# Patient Record
Sex: Female | Born: 2000 | Race: Black or African American | Hispanic: No | Marital: Single
Health system: Southern US, Community
[De-identification: ages and names within clinical notes are randomized; demographics above are authoritative.]

## PROBLEM LIST (undated history)

## (undated) DIAGNOSIS — R51 Headache: Secondary | ICD-10-CM

## (undated) HISTORY — DX: Headache: R51

---
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
## 2021-02-28 ENCOUNTER — Emergency Department (HOSPITAL_COMMUNITY)
Admission: EM | Admit: 2021-02-28 | Discharge: 2021-02-28 | Disposition: A | Discharge status: Home / Self Care | Payer: Medicaid Other | Attending: Emergency Medicine | Admitting: Emergency Medicine

## 2021-02-28 ENCOUNTER — Encounter (HOSPITAL_COMMUNITY): Payer: Self-pay | Admitting: Emergency Medicine

## 2021-02-28 ENCOUNTER — Other Ambulatory Visit: Payer: Self-pay

## 2021-02-28 ENCOUNTER — Emergency Department (HOSPITAL_COMMUNITY): Payer: Medicaid Other

## 2021-02-28 DIAGNOSIS — Z23 Encounter for immunization: Secondary | ICD-10-CM | POA: Diagnosis not present

## 2021-02-28 DIAGNOSIS — S65211A Laceration of superficial palmar arch of right hand, initial encounter: Secondary | ICD-10-CM | POA: Diagnosis not present

## 2021-02-28 DIAGNOSIS — Z7722 Contact with and (suspected) exposure to environmental tobacco smoke (acute) (chronic): Secondary | ICD-10-CM | POA: Insufficient documentation

## 2021-02-28 DIAGNOSIS — Y93E5 Activity, floor mopping and cleaning: Secondary | ICD-10-CM | POA: Insufficient documentation

## 2021-02-28 DIAGNOSIS — W268XXA Contact with other sharp object(s), not elsewhere classified, initial encounter: Secondary | ICD-10-CM | POA: Insufficient documentation

## 2021-02-28 DIAGNOSIS — S6991XA Unspecified injury of right wrist, hand and finger(s), initial encounter: Secondary | ICD-10-CM | POA: Diagnosis present

## 2021-02-28 DIAGNOSIS — M25519 Pain in unspecified shoulder: Secondary | ICD-10-CM

## 2021-02-28 MED ORDER — TETANUS-DIPHTH-ACELL PERTUSSIS 5-2.5-18.5 LF-MCG/0.5 IM SUSY
0.5000 mL | PREFILLED_SYRINGE | Freq: Once | INTRAMUSCULAR | Status: AC
  Administered 2021-02-28: 0.5 mL via INTRAMUSCULAR
  Filled 2021-02-28: qty 0.5

## 2021-02-28 NOTE — ED Provider Notes (Signed)
[facility]-EMERGENCY DEPT Provider Note   CSN: [id number] Arrival date & time: 02/28/21  0118     History Chief Complaint  Patient presents with   Arm Pain    Alexis Parker is a 20 y.o. female.  The history is provided by the patient. No language interpreter was used.  Arm Pain   20 year old female presenting for evaluation of hand laceration.  Patient report yesterday she was cleaning a fan when she accidentally cut her right dominant hand on a sharp edge.  She did try to wash the wound.  Since then she noticed intermittent pain that radiates towards her arm which concerns her.  Pain is intermittent and mild at this time.  No numbness or weakness no neck pain.  No other complaint.  She is not up-to-date with tetanus.  Past Medical History:  Diagnosis Date   Headache(784.0)     Patient Active Problem List   Diagnosis Date Noted   Migraine without aura and without status migrainosus, not intractable 01/30/2014   Tension headache 01/30/2014    History reviewed. No pertinent surgical history.   OB History   No obstetric history on file.     Family History  Problem Relation Age of Onset   Migraines Maternal Aunt    Depression Maternal Aunt    Anxiety disorder Maternal Aunt    Multiple sclerosis Maternal Grandfather    Heart attack Paternal Grandmother    Epilepsy Other     Social History   Tobacco Use   Smoking status: Passive Smoke Exposure - Never Smoker   Smokeless tobacco: Never  Substance Use Topics   Alcohol use: No   Drug use: No    Home Medications Prior to Admission medications   Medication Sig Start Date End Date Taking? Authorizing Provider  amitriptyline (ELAVIL) 25 MG tablet Take 1 tablet (25 mg total) by mouth at bedtime. 03/19/18   Keturah Shavers, MD  cetirizine (ZYRTEC) 10 MG tablet Take 10 mg by mouth daily. 03/04/18   [provider]  ibuprofen (ADVIL,MOTRIN) 100 MG/5ML suspension Take 5 mg/kg by mouth  every 6 (six) hours as needed.    [provider]  ibuprofen (ADVIL,MOTRIN) 600 MG tablet TAKE 1 TABLET BY MOUTH EVERY 6 HOURS AS NEEDED WITH FOOD 03/12/18   [provider]  Magnesium Oxide 250 MG TABS Take by mouth.    [provider]  nitrofurantoin, macrocrystal-monohydrate, (MACROBID) 100 MG capsule Take 1 capsule (100 mg total) by mouth 2 (two) times daily. 08/16/18   Wallis Bamberg, PA-C  riboflavin (VITAMIN B-2) 100 MG TABS tablet Take 100 mg by mouth daily.    [provider]  TRI-LO-MARZIA 0.18/0.215/0.25 MG-25 MCG tab Take 1 tablet by mouth daily. 03/04/18   [provider]    Allergies    Patient has no known allergies.  Review of Systems   Review of Systems  Constitutional:  Negative for fever.  Skin:  Positive for wound.   Physical Exam Updated Vital Signs BP 108/72 (BP Location: Left Arm)   Pulse 70   Temp 98.3 F (36.8 C) (Oral)   Resp 16   Ht 4' 10.5" (1.486 m)   Wt 58.5 kg   SpO2 100%   BMI 26.50 kg/m   Physical Exam Vitals and nursing note reviewed.  Constitutional:      General: She is not in acute distress.    Appearance: She is well-developed.  HENT:     Head: Atraumatic.  Eyes:  Conjunctiva/sclera: Conjunctivae normal.  Pulmonary:     Effort: Pulmonary effort is normal.  Musculoskeletal:     Cervical back: Neck supple.     Comments: Right arm: Sensation is intact throughout the arm with no significant tenderness along the forearm or upper arm.  Able to flex and extend and move throughout the joints of the breast, elbow, and shoulder.  Skin:    Findings: No rash.     Comments: Right hand: There is a very superficial laceration approximately 2 cm noted overlying the thenar eminence without any foreign body noted.  No bleeding.  Neurological:     Mental Status: She is alert.  Psychiatric:        Mood and Affect: Mood normal.    ED Results / Procedures / Treatments   Labs (all labs ordered are listed,  but only abnormal results are displayed) Labs Reviewed - No data to display  EKG None  Radiology DG Shoulder Right  Result Date: 02/28/2021 CLINICAL DATA:  Arm pain EXAM: RIGHT SHOULDER - 2+ VIEW COMPARISON:  None. FINDINGS: No fracture or dislocation is seen. The joint spaces are preserved. Visualized soft tissues are within normal limits. Visualized right lung is clear. IMPRESSION: Negative. Electronically Signed   By: Charline Bills M.D.   On: 02/28/2021 03:49   DG Hand Complete Right  Result Date: 02/28/2021 CLINICAL DATA:  Hand pain, laceration EXAM: RIGHT HAND - COMPLETE 3+ VIEW COMPARISON:  None. FINDINGS: No fracture or dislocation is seen. The joint spaces are preserved. The visualized soft tissues are unremarkable. No radiopaque foreign body is seen. IMPRESSION: No fracture, dislocation, or radiopaque foreign body is seen. Electronically Signed   By: Charline Bills M.D.   On: 02/28/2021 03:49    Procedures Procedures   Medications Ordered in ED Medications - No data to display  ED Course  I have reviewed the triage vital signs and the nursing notes.  Pertinent labs & imaging results that were available during my care of the patient were reviewed by me and considered in my medical decision making (see chart for details).    MDM Rules/Calculators/A&P                           SUBJECTIVE: BP 108/72 (BP Location: Left Arm)   Pulse 70   Temp 98.3 F (36.8 C) (Oral)   Resp 16   Ht 4' 10.5" (1.486 m)   Wt 58.5 kg   SpO2 100%   BMI 26.50 kg/m   Final Clinical Impression(s) / ED Diagnoses Final diagnoses:  Shoulder pain  Laceration of superficial palmar arch of right hand, initial encounter    Rx / DC Orders ED Discharge Orders     None      Patient complaining of right arm pain after suffering a cut to her right palm.  This is a very superficial laceration without any neurovascular involvement.  On exam unremarkable.  X-rays unremarkable.  Reassurance  given.  Patient will receive a Tdap here.  Otherwise she is stable for discharge.  Laceration does not require any repair.   Fayrene Helper, PA-C 02/28/21 [id number]    Koleen Distance, MD 02/28/21 1556

## 2021-02-28 NOTE — ED Notes (Signed)
Pt provided ice pack 

## 2021-02-28 NOTE — ED Triage Notes (Signed)
Pt cut her right hand on her palm and has pain from her hand up her right arm. Pt states her pain is 9/10

## 2022-01-28 ENCOUNTER — Other Ambulatory Visit: Payer: Self-pay

## 2022-01-28 ENCOUNTER — Emergency Department (HOSPITAL_BASED_OUTPATIENT_CLINIC_OR_DEPARTMENT_OTHER)
Admission: EM | Admit: 2022-01-28 | Discharge: 2022-01-28 | Disposition: A | Discharge status: Home / Self Care | Payer: Medicaid Other | Attending: Emergency Medicine | Admitting: Emergency Medicine

## 2022-01-28 DIAGNOSIS — S29019A Strain of muscle and tendon of unspecified wall of thorax, initial encounter: Secondary | ICD-10-CM

## 2022-01-28 DIAGNOSIS — S29012A Strain of muscle and tendon of back wall of thorax, initial encounter: Secondary | ICD-10-CM | POA: Diagnosis not present

## 2022-01-28 DIAGNOSIS — Y92410 Unspecified street and highway as the place of occurrence of the external cause: Secondary | ICD-10-CM | POA: Insufficient documentation

## 2022-01-28 DIAGNOSIS — S299XXA Unspecified injury of thorax, initial encounter: Secondary | ICD-10-CM | POA: Diagnosis present

## 2022-01-28 MED ORDER — NAPROXEN 500 MG PO TABS: 500.0000 mg | ORAL_TABLET | Freq: Two times a day (BID) | ORAL | 0 refills | Status: DC

## 2022-01-28 NOTE — ED Notes (Addendum)
Error

## 2022-01-28 NOTE — ED Notes (Signed)
Pt agreeable with d/c plan as discussed by provider- this nurse has verbally reinforced d/c instructions and provided pt with written copy - pt acknowledges verbal understanding and denies any additional questions, concerns, needs  

## 2022-01-28 NOTE — Discharge Instructions (Signed)
Apply ice for 30 minutes at a time, 4 times a day.  If you need additional pain relief, you may take acetaminophen.  When you combine acetaminophen and naproxen, you get better pain relief and you get from taking either medication by itself.  Please follow-up with a sports medicine physician.  You may benefit from a course of physical therapy.
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
## 2022-01-28 NOTE — ED Notes (Signed)
ED provider at bedside.  Pt lying awake in bed - alert and oriented x4.  RR even and unlabored on RA with symmetrical rise and fall of chest.  No obvious bony deformities -- RUE full ROM observed during ED provider exam and RUE distal neurovascular status remains intact.
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
## 2022-03-04 ENCOUNTER — Emergency Department (HOSPITAL_COMMUNITY): Payer: Medicaid Other

## 2022-03-04 ENCOUNTER — Encounter (HOSPITAL_COMMUNITY): Payer: Self-pay | Admitting: Pharmacy Technician

## 2022-03-04 ENCOUNTER — Other Ambulatory Visit: Payer: Self-pay

## 2022-03-04 ENCOUNTER — Emergency Department (HOSPITAL_COMMUNITY)
Admission: EM | Admit: 2022-03-04 | Discharge: 2022-03-04 | Discharge status: Against Medical Advice | Payer: Medicaid Other | Attending: Emergency Medicine | Admitting: Emergency Medicine

## 2022-03-04 DIAGNOSIS — W502XXA Accidental twist by another person, initial encounter: Secondary | ICD-10-CM | POA: Diagnosis not present

## 2022-03-04 DIAGNOSIS — Z5321 Procedure and treatment not carried out due to patient leaving prior to being seen by health care provider: Secondary | ICD-10-CM | POA: Diagnosis not present

## 2022-03-04 DIAGNOSIS — M25572 Pain in left ankle and joints of left foot: Secondary | ICD-10-CM | POA: Insufficient documentation

## 2022-03-04 NOTE — ED Notes (Signed)
Pt called x1

## 2022-03-04 NOTE — ED Triage Notes (Signed)
Pt here with L ankle pain for approx 1 week after missing a step and twisting her ankle. Pt ambulatory to triage room without difficulty.

## 2022-03-04 NOTE — ED Provider Triage Note (Signed)
Emergency Medicine Provider Triage Evaluation Note  Alexis Parker , a 21 y.o. female  was evaluated in triage.  Pt complains of left ankle pain.  Happened a week ago after inverting the ankle and on the step wrong.  She has been walking on it but with some difficulty. Review of Systems  Per HPI  Physical Exam  BP 108/73 (BP Location: Right Arm)   Pulse 75   Temp 98.9 F (37.2 C) (Oral)   Resp 14   SpO2 100%  Gen:   Awake, no distress   Resp:  Normal effort  MSK:   Moves extremities without difficulty  Other:  Flexion extension with pain.  Inversion eversion without any difficulty.  A patient is able to bear weight..  Medical Decision Making  Medically screening exam initiated at 5:51 PM.  Appropriate orders placed.  Alexis Parker was informed that the remainder of the evaluation will be completed by another provider, this initial triage assessment does not replace that evaluation, and the importance of remaining in the ED until their evaluation is complete.     Theron Arista, PA-C 03/04/22 1753

## 2022-03-04 NOTE — ED Notes (Signed)
Pt called X2

## 2022-10-21 ENCOUNTER — Other Ambulatory Visit: Payer: Self-pay

## 2022-10-21 ENCOUNTER — Encounter (HOSPITAL_COMMUNITY): Payer: Self-pay | Admitting: Emergency Medicine

## 2022-10-21 ENCOUNTER — Emergency Department (HOSPITAL_COMMUNITY)
Admission: EM | Admit: 2022-10-21 | Discharge: 2022-10-21 | Disposition: A | Discharge status: Home / Self Care | Payer: Medicaid Other | Attending: Emergency Medicine | Admitting: Emergency Medicine

## 2022-10-21 DIAGNOSIS — M545 Low back pain, unspecified: Secondary | ICD-10-CM | POA: Insufficient documentation

## 2022-10-21 DIAGNOSIS — D72829 Elevated white blood cell count, unspecified: Secondary | ICD-10-CM | POA: Diagnosis not present

## 2022-10-21 DIAGNOSIS — R350 Frequency of micturition: Secondary | ICD-10-CM | POA: Diagnosis present

## 2022-10-21 DIAGNOSIS — N390 Urinary tract infection, site not specified: Secondary | ICD-10-CM | POA: Insufficient documentation

## 2022-10-21 LAB — URINALYSIS, ROUTINE W REFLEX MICROSCOPIC
Bilirubin Urine: NEGATIVE
Glucose, UA: NEGATIVE mg/dL
Hgb urine dipstick: NEGATIVE
Ketones, ur: 5 mg/dL — AB
Nitrite: NEGATIVE
Protein, ur: 30 mg/dL — AB
Specific Gravity, Urine: 1.016 (ref 1.005–1.030)
WBC, UA: >50 WBC/hpf (ref 0–5)
pH: 7 (ref 5.0–8.0)

## 2022-10-21 LAB — PREGNANCY, URINE: Preg Test, Ur: NEGATIVE

## 2022-10-21 MED ORDER — NITROFURANTOIN MONOHYD MACRO 100 MG PO CAPS
100.0000 mg | ORAL_CAPSULE | Freq: Once | ORAL | Status: AC
  Administered 2022-10-21: 100 mg via ORAL
  Filled 2022-10-21: qty 1

## 2022-10-21 MED ORDER — NITROFURANTOIN MONOHYD MACRO 100 MG PO CAPS: 100.0000 mg | ORAL_CAPSULE | Freq: Two times a day (BID) | ORAL | 0 refills | Status: DC

## 2022-10-21 NOTE — ED Triage Notes (Signed)
Pt in with UTI symptoms for a few days. Reports dysuria, low back pain and urinary frequency. Denies any fevers

## 2022-10-21 NOTE — ED Provider Notes (Signed)
[place] EMERGENCY DEPARTMENT AT [facility] Provider Note   CSN: [id number] Arrival date & time: 10/21/22  [id number]     History  Chief Complaint  Patient presents with   Urinary Frequency    Alexis Parker is a 22 y.o. female.   Urinary Frequency  Patient is a 22 year old female present emergency room today with complaints of urinary frequency urgency and dysuria.  She states her symptoms been ongoing for 2 days.  She has not had any fever nausea vomiting or abdominal pain.  No lightheadedness or dizziness.  Does endorse some achy low back pain and states that she has had similar symptoms with UTIs in the past she has only had a few UTIs.  She denies any medical problems.  No vaginal discharge      Home Medications Prior to Admission medications   Medication Sig Start Date End Date Taking? Authorizing Provider  nitrofurantoin, macrocrystal-monohydrate, (MACROBID) 100 MG capsule Take 1 capsule (100 mg total) by mouth 2 (two) times daily. 10/21/22  Yes Meshell Abdulaziz S, PA  amitriptyline (ELAVIL) 25 MG tablet Take 1 tablet (25 mg total) by mouth at bedtime. 03/19/18   Teressa Lower, MD  cetirizine (ZYRTEC) 10 MG tablet Take 10 mg by mouth daily. 03/04/18   [provider]  Magnesium Oxide 250 MG TABS Take by mouth.    [provider]  naproxen (NAPROSYN) 500 MG tablet Take 1 tablet (500 mg total) by mouth 2 (two) times daily. [id number]   Delora Fuel, MD  riboflavin (VITAMIN B-2) 100 MG TABS tablet Take 100 mg by mouth daily.    [provider]      Allergies    Patient has no known allergies.    Review of Systems   Review of Systems  Genitourinary:  Positive for frequency.    Physical Exam Updated Vital Signs BP 114/79   Pulse 80   Temp 98.5 F (36.9 C) (Oral)   Resp 18   Wt 58.5 kg   LMP 10/04/2022 (Exact Date)   SpO2 98%   BMI 26.50 kg/m  Physical Exam Vitals and nursing note reviewed.  Constitutional:      General: She is  not in acute distress. HENT:     Head: Normocephalic and atraumatic.     Nose: Nose normal.  Eyes:     General: No scleral icterus. Cardiovascular:     Rate and Rhythm: Normal rate and regular rhythm.     Pulses: Normal pulses.     Heart sounds: Normal heart sounds.  Pulmonary:     Effort: Pulmonary effort is normal. No respiratory distress.     Breath sounds: No wheezing.  Abdominal:     Palpations: Abdomen is soft.     Tenderness: There is no abdominal tenderness. There is no guarding or rebound.  Musculoskeletal:     Cervical back: Normal range of motion.     Right lower leg: No edema.     Left lower leg: No edema.  Skin:    General: Skin is warm and dry.     Capillary Refill: Capillary refill takes less than 2 seconds.  Neurological:     Mental Status: She is alert. Mental status is at baseline.  Psychiatric:        Mood and Affect: Mood normal.        Behavior: Behavior normal.     ED Results / Procedures / Treatments   Labs (all labs ordered are listed, but only abnormal results  are displayed) Labs Reviewed  URINALYSIS, ROUTINE W REFLEX MICROSCOPIC - Abnormal; Notable for the following components:      Result Value   APPearance HAZY (*)    Ketones, ur 5 (*)    Protein, ur 30 (*)    Leukocytes,Ua LARGE (*)    Bacteria, UA FEW (*)    All other components within normal limits  PREGNANCY, URINE    EKG None  Radiology No results found.  Procedures Procedures    Medications Ordered in ED Medications  nitrofurantoin (macrocrystal-monohydrate) (MACROBID) capsule 100 mg (100 mg Oral Given 10/21/22 0502)    ED Course/ Medical Decision Making/ A&P                             Medical Decision Making Amount and/or Complexity of Data Reviewed Labs: ordered.  Risk Prescription drug management.   Patient is a 22 year old female present emergency room today with complaints of urinary frequency urgency and dysuria.  She states her symptoms been ongoing for 2  days.  She has not had any fever nausea vomiting or abdominal pain.  No lightheadedness or dizziness.  Does endorse some achy low back pain and states that she has had similar symptoms with UTIs in the past she has only had a few UTIs.  She denies any medical problems.  No vaginal discharge  Physical exam unremarkable no abdominal tenderness guarding or rebound.  No CVA tenderness  Urinalysis with few bacteria but greater than 50 WBCs large leukocytes, negative urine pregnancy test.  Will treat with Macrobid, recommend hydration follow-up with PCP.  Return precautions discussed.  Tylenol for now.   Final Clinical Impression(s) / ED Diagnoses Final diagnoses:  Lower urinary tract infectious disease    Rx / DC Orders ED Discharge Orders          Ordered    nitrofurantoin, macrocrystal-monohydrate, (MACROBID) 100 MG capsule  2 times daily        10/21/22 0454              Gailen Shelter, PA 10/21/22 0524    Nira Conn, MD 10/25/22 1747

## 2022-10-21 NOTE — Discharge Instructions (Addendum)
Please take all of your antibiotics until finished!   You may develop abdominal discomfort or diarrhea from the antibiotic.  You may help offset this with probiotics which you can buy or get in yogurt. Do not eat  or take the probiotics until 2 hours after your antibiotic.  ° ° ° °

## 2022-10-21 NOTE — ED Notes (Signed)
Patient verbalizes understanding of discharge instructions. Opportunity for questioning and answers were provided. Armband removed by staff, pt discharged from ED. Ambulated out to lobby  

## 2022-11-21 ENCOUNTER — Telehealth: Payer: Medicaid Other | Admitting: Family Medicine

## 2022-11-21 DIAGNOSIS — N3000 Acute cystitis without hematuria: Secondary | ICD-10-CM | POA: Diagnosis not present

## 2022-11-21 MED ORDER — CEPHALEXIN 500 MG PO CAPS: 500.0000 mg | ORAL_CAPSULE | Freq: Two times a day (BID) | ORAL | 0 refills | Status: AC

## 2022-11-21 NOTE — Progress Notes (Signed)

## 2022-12-01 IMAGING — CR DG SHOULDER 2+V*R*
3 series · 3 of 3 positions shown · non-contrast
Comparison: None.

CLINICAL DATA: Arm pain

EXAM:
RIGHT SHOULDER - 2+ VIEW

[w shoulder external right]
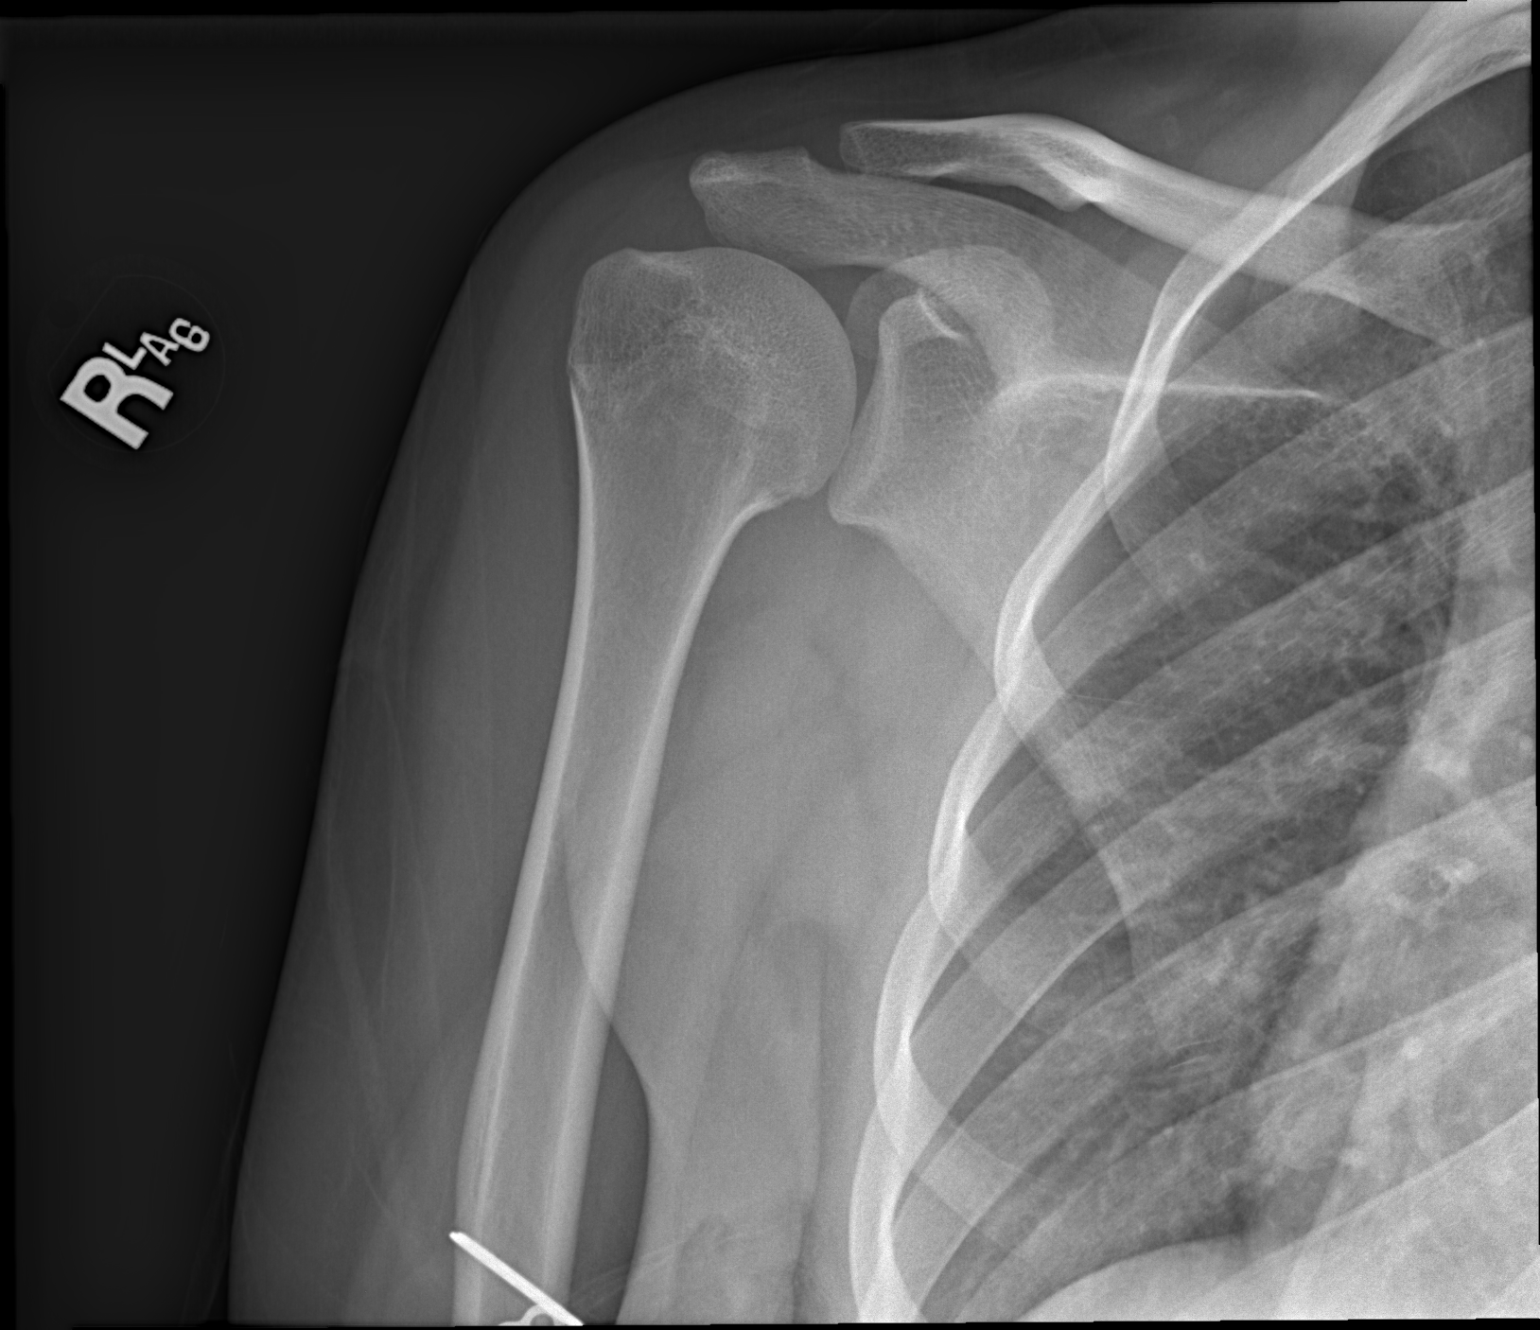

[w shoulder internal right]
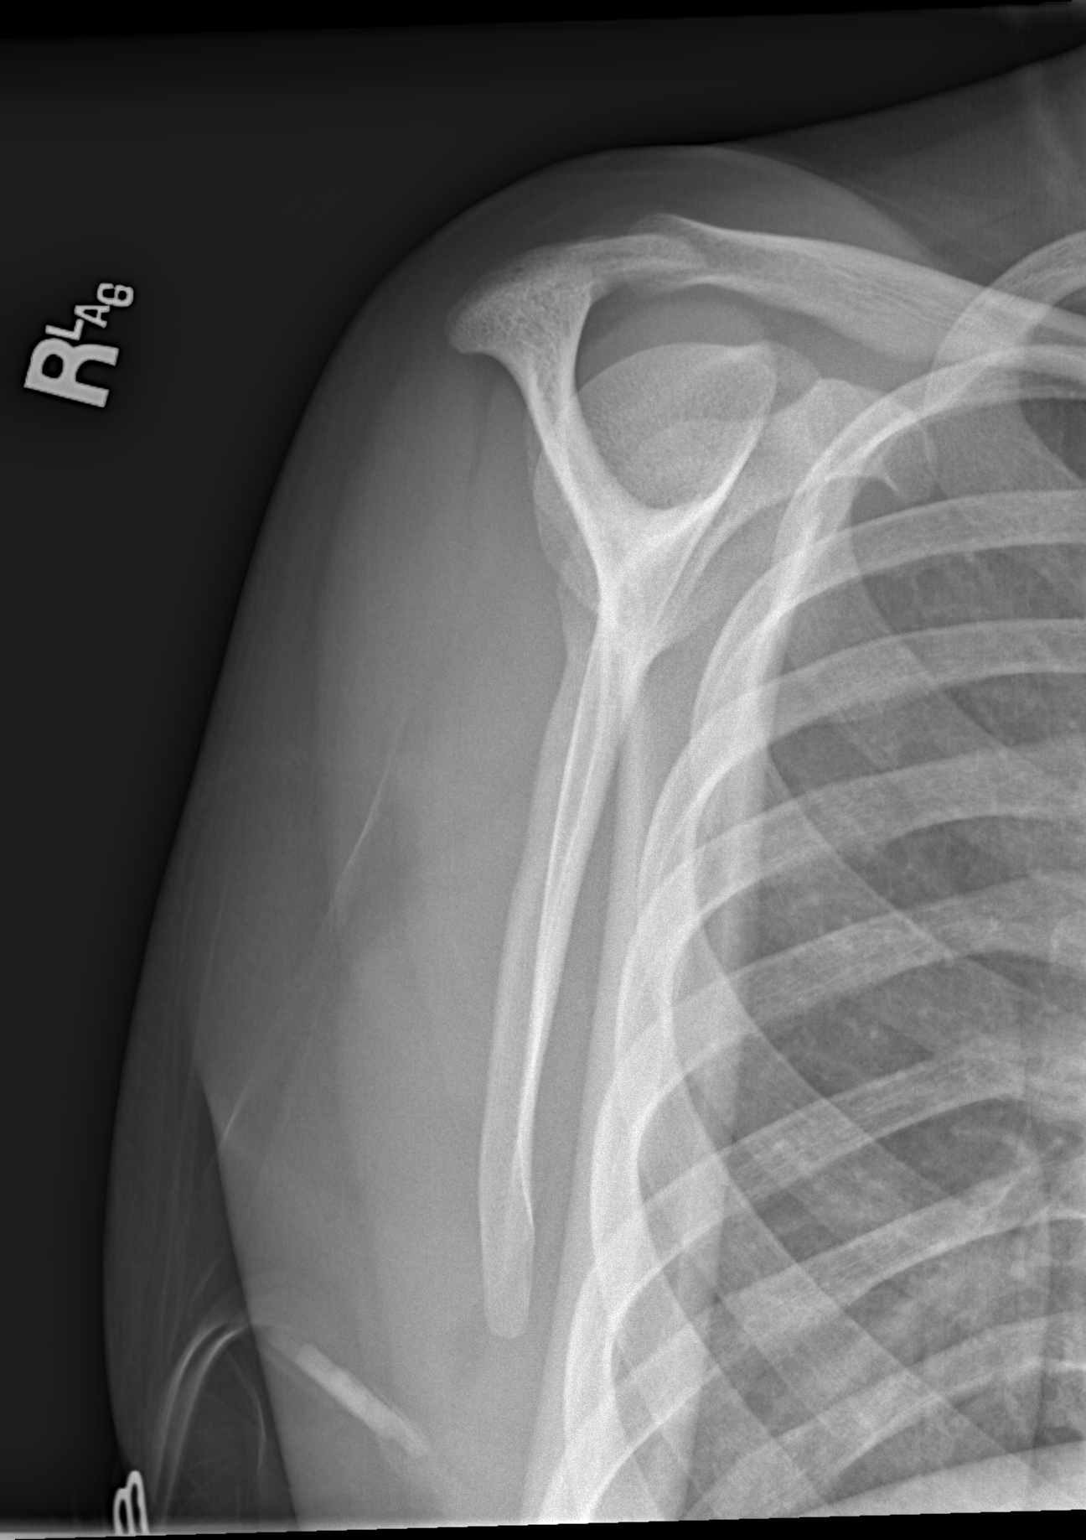

[x shoulder ap right]
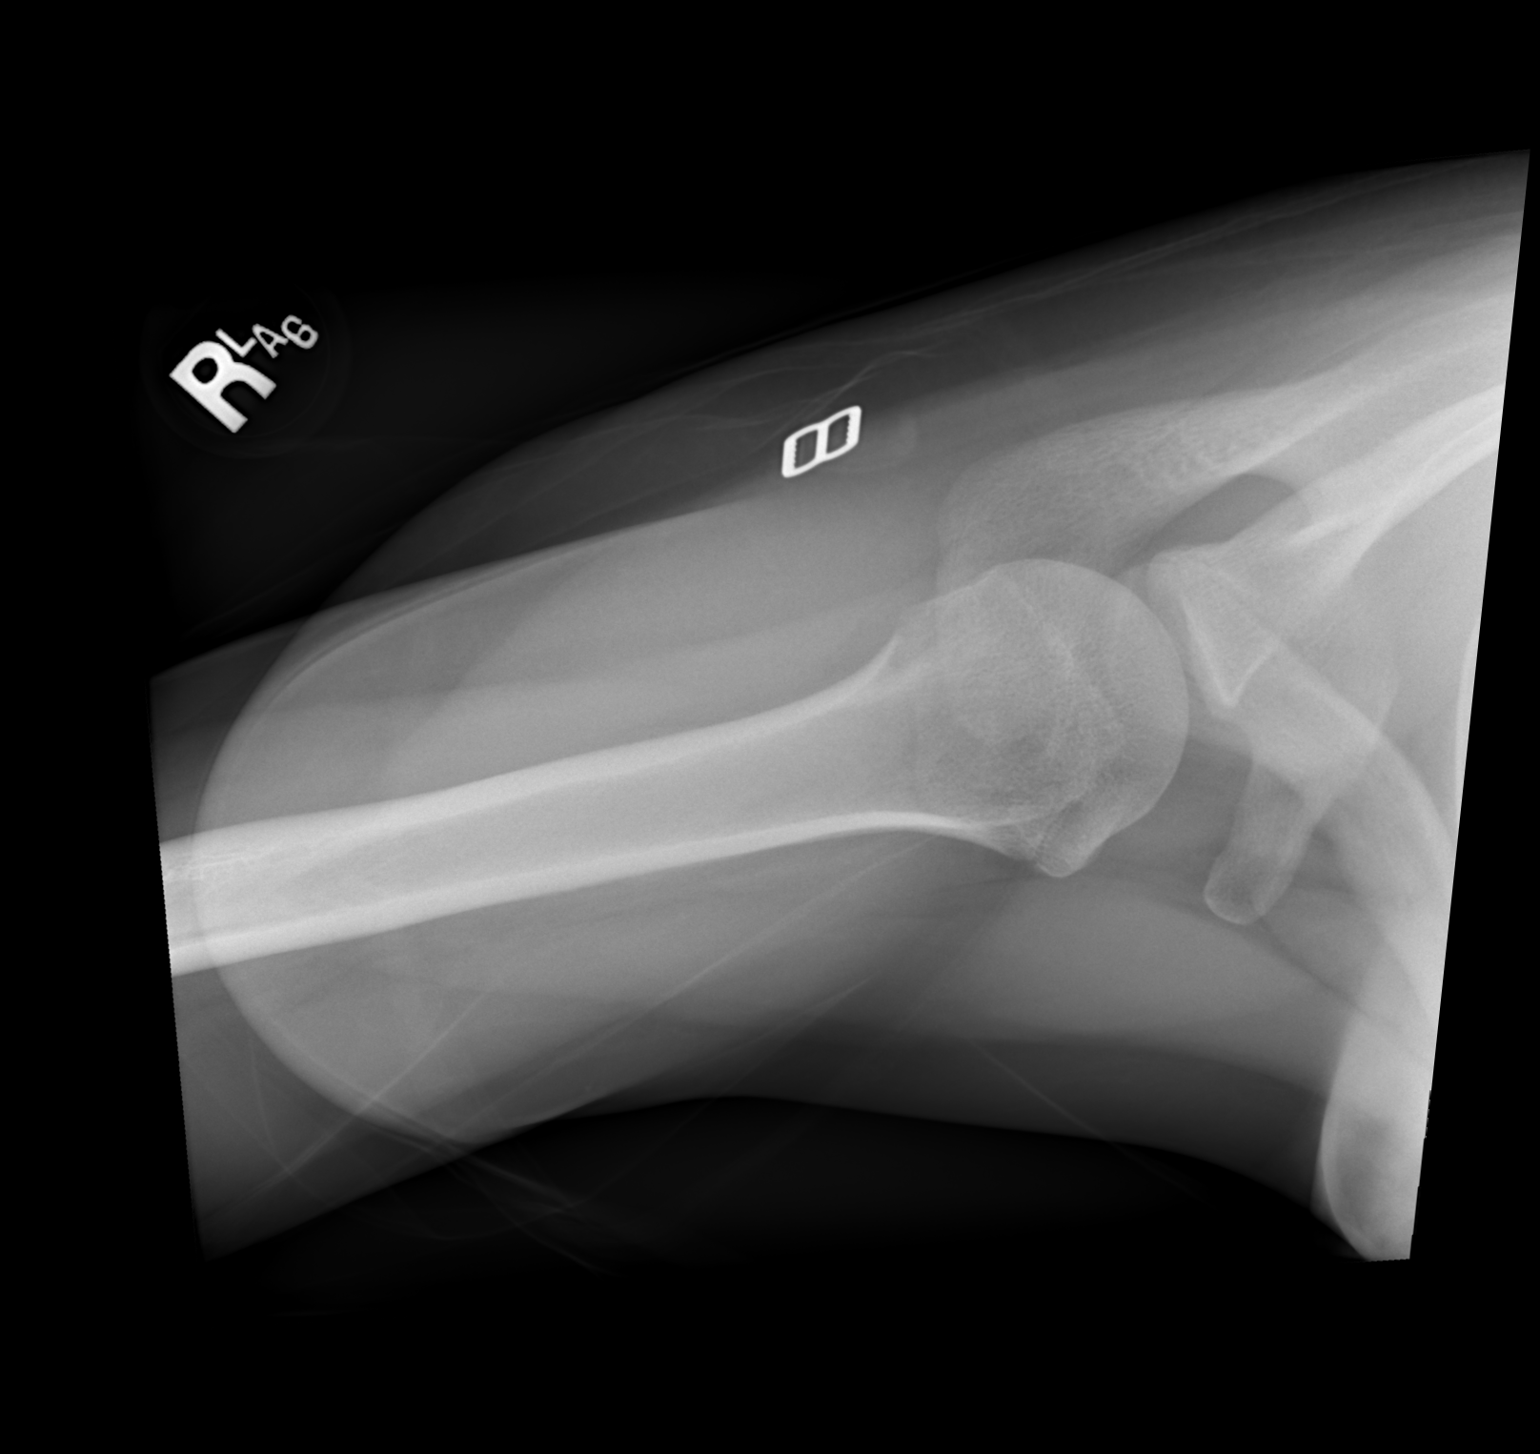

[3 of 3 positions shown; findings below may reference images not displayed]

FINDINGS: No fracture or dislocation is seen.

The joint spaces are preserved.

Visualized soft tissues are within normal limits.

Visualized right lung is clear.
IMPRESSION: Negative.

## 2023-02-01 ENCOUNTER — Emergency Department (HOSPITAL_COMMUNITY)
Admission: EM | Admit: 2023-02-01 | Discharge: 2023-02-02 | Disposition: A | Discharge status: Home / Self Care | Payer: Medicaid Other | Attending: Emergency Medicine | Admitting: Emergency Medicine

## 2023-02-01 ENCOUNTER — Other Ambulatory Visit: Payer: Self-pay

## 2023-02-01 ENCOUNTER — Encounter (HOSPITAL_COMMUNITY): Payer: Self-pay | Admitting: Emergency Medicine

## 2023-02-01 ENCOUNTER — Emergency Department (HOSPITAL_COMMUNITY): Payer: Medicaid Other

## 2023-02-01 DIAGNOSIS — Y92410 Unspecified street and highway as the place of occurrence of the external cause: Secondary | ICD-10-CM | POA: Diagnosis not present

## 2023-02-01 DIAGNOSIS — M25511 Pain in right shoulder: Secondary | ICD-10-CM | POA: Diagnosis not present

## 2023-02-01 DIAGNOSIS — S20212A Contusion of left front wall of thorax, initial encounter: Secondary | ICD-10-CM | POA: Diagnosis not present

## 2023-02-01 DIAGNOSIS — M898X1 Other specified disorders of bone, shoulder: Secondary | ICD-10-CM

## 2023-02-01 DIAGNOSIS — R519 Headache, unspecified: Secondary | ICD-10-CM | POA: Diagnosis not present

## 2023-02-01 DIAGNOSIS — S0081XA Abrasion of other part of head, initial encounter: Secondary | ICD-10-CM | POA: Insufficient documentation

## 2023-02-01 DIAGNOSIS — M545 Low back pain, unspecified: Secondary | ICD-10-CM | POA: Diagnosis not present

## 2023-02-01 DIAGNOSIS — S299XXA Unspecified injury of thorax, initial encounter: Secondary | ICD-10-CM | POA: Diagnosis present

## 2023-02-01 LAB — HCG, SERUM, QUALITATIVE: Preg, Serum: NEGATIVE
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
## 2023-02-02 MED ORDER — IBUPROFEN 600 MG PO TABS: 600.0000 mg | ORAL_TABLET | Freq: Four times a day (QID) | ORAL | 0 refills | Status: DC | PRN

## 2023-02-02 MED ORDER — METHOCARBAMOL 500 MG PO TABS: 500.0000 mg | ORAL_TABLET | Freq: Two times a day (BID) | ORAL | 0 refills | Status: DC
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
## 2023-10-03 ENCOUNTER — Encounter (HOSPITAL_COMMUNITY): Payer: Self-pay

## 2023-10-03 ENCOUNTER — Ambulatory Visit (HOSPITAL_COMMUNITY): Admission: EM | Admit: 2023-10-03 | Discharge: 2023-10-03 | Disposition: A | Discharge status: Home / Self Care

## 2023-10-03 DIAGNOSIS — M62830 Muscle spasm of back: Secondary | ICD-10-CM

## 2023-10-03 DIAGNOSIS — M25511 Pain in right shoulder: Secondary | ICD-10-CM

## 2023-10-03 DIAGNOSIS — G8929 Other chronic pain: Secondary | ICD-10-CM

## 2023-10-03 MED ORDER — DEXAMETHASONE SODIUM PHOSPHATE 10 MG/ML IJ SOLN
INTRAMUSCULAR | Status: AC
  Filled 2023-10-03: qty 1

## 2023-10-03 MED ORDER — METHOCARBAMOL 500 MG PO TABS: 500.0000 mg | ORAL_TABLET | Freq: Two times a day (BID) | ORAL | 0 refills | Status: AC

## 2023-10-03 MED ORDER — DEXAMETHASONE SODIUM PHOSPHATE 10 MG/ML IJ SOLN
10.0000 mg | Freq: Once | INTRAMUSCULAR | Status: AC
  Administered 2023-10-03: 10 mg via INTRAMUSCULAR
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
## 2023-10-03 NOTE — Discharge Instructions (Signed)
 You were given an injection of Decadron today which is a steroid to assist with your inflammation.  I have also prescribed Robaxin which is a muscle relaxer that you can take twice daily as needed for muscle pain and spasms.  This can make you drowsy so do not work, drive, or drink alcohol while taking.  Otherwise you can alternate between 650 mg of Tylenol and in 400 to 600 mg of ibuprofen every 4-6 hours as needed for pain.  I also recommend applying heat to assist with pain and doing some gentle stretching.  I recommend following up with your physical therapy that you are established with for other management of your pain.  Return here as needed.
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
## 2024-02-07 ENCOUNTER — Encounter (HOSPITAL_COMMUNITY): Payer: Self-pay

## 2024-02-07 ENCOUNTER — Ambulatory Visit (HOSPITAL_COMMUNITY)
Admission: EM | Admit: 2024-02-07 | Discharge: 2024-02-07 | Disposition: A | Discharge status: Home / Self Care | Attending: Emergency Medicine | Admitting: Emergency Medicine

## 2024-02-07 DIAGNOSIS — R0789 Other chest pain: Secondary | ICD-10-CM

## 2024-02-07 DIAGNOSIS — R42 Dizziness and giddiness: Secondary | ICD-10-CM | POA: Diagnosis not present

## 2024-02-07 DIAGNOSIS — Z3202 Encounter for pregnancy test, result negative: Secondary | ICD-10-CM

## 2024-02-07 DIAGNOSIS — R110 Nausea: Secondary | ICD-10-CM | POA: Diagnosis not present

## 2024-02-07 DIAGNOSIS — M79604 Pain in right leg: Secondary | ICD-10-CM | POA: Diagnosis not present

## 2024-02-07 DIAGNOSIS — G8929 Other chronic pain: Secondary | ICD-10-CM

## 2024-02-07 DIAGNOSIS — M79605 Pain in left leg: Secondary | ICD-10-CM

## 2024-02-07 LAB — POCT FASTING CBG KUC MANUAL ENTRY: POCT Glucose (KUC): 97 mg/dL (ref 70–99)

## 2024-02-07 LAB — POCT URINE PREGNANCY: Preg Test, Ur: NEGATIVE

## 2024-02-07 NOTE — ED Provider Notes (Signed)
 [facility]    CSN: [id number] Arrival date & time: 02/07/24  1513      History   Chief Complaint Chief Complaint  Patient presents with   Dizziness   Leg Pain    HPI Alexis Parker is a 23 y.o. female.   Patient presents with intermittent nausea, dizziness, and chest tightness x 1 month.  Patient states that she usually notices this early in the morning while she is at work.  Patient states that all of the symptoms occur at the same time and the chest tightness normally starts first.  Patient denies any symptoms at this time.  Denies severe chest pain, palpitations, headache, vomiting, blurry vision, numbness, tingling, weakness, and passing out.  Patient states that she has also had intermittent bilateral lower leg pain for about a year now.  Patient denies any falls or injuries related to this.  Patient denies taking any medication for this.  Patient does report that she is scheduled for a primary care appointment tomorrow for further evaluation of this.  Patient is here requesting a work note for today.  LMP 6/29.  Patient is receiving Depo injections currently.  The history is provided by the patient and medical records.  Dizziness Leg Pain   Past Medical History:  Diagnosis Date   Headache(784.0)     Patient Active Problem List   Diagnosis Date Noted   Migraine without aura and without status migrainosus, not intractable 01/30/2014   Tension headache 01/30/2014    History reviewed. No pertinent surgical history.  OB History   No obstetric history on file.      Home Medications    Prior to Admission medications   Medication Sig Start Date End Date Taking? Authorizing Provider  cetirizine (ZYRTEC) 10 MG tablet Take 10 mg by mouth daily. 03/04/18   [provider]  medroxyPROGESTERone Acetate 150 MG/ML SUSY Inject 1 mL into the muscle every 3 (three) months. 09/03/23   [provider]  methocarbamol  (ROBAXIN ) 500 MG tablet Take  1 tablet (500 mg total) by mouth 2 (two) times daily. 10/03/23   Johnie Flaming A, NP  riboflavin (VITAMIN B-2) 100 MG TABS tablet Take 100 mg by mouth daily.    [provider]    Family History Family History  Problem Relation Age of Onset   Multiple sclerosis Maternal Grandfather    Heart attack Paternal Grandmother    Migraines Maternal Aunt    Depression Maternal Aunt    Anxiety disorder Maternal Aunt    Epilepsy Other     Social History Social History   Tobacco Use   Smoking status: Every Day    Types: E-cigarettes    Passive exposure: Yes   Smokeless tobacco: Never  Vaping Use   Vaping status: Every Day  Substance Use Topics   Alcohol use: Yes    Comment: occasionally   Drug use: No     Allergies   Patient has no known allergies.   Review of Systems Review of Systems  Neurological:  Positive for dizziness.   Per HPI  Physical Exam Triage Vital Signs ED Triage Vitals  Encounter Vitals Group     BP 02/07/24 1620 106/64     Girls Systolic BP Percentile --      Girls Diastolic BP Percentile --      Boys Systolic BP Percentile --      Boys Diastolic BP Percentile --      Pulse Rate 02/07/24 1620 73  Resp 02/07/24 1620 16     Temp 02/07/24 1620 99 F (37.2 C)     Temp Source 02/07/24 1620 Oral     SpO2 02/07/24 1620 98 %     Weight --      Height --      Head Circumference --      Peak Flow --      Pain Score 02/07/24 1621 4     Pain Loc --      Pain Education --      Exclude from Growth Chart --    No data found.  Updated Vital Signs BP 106/64 (BP Location: Right Arm)   Pulse 73   Temp 99 F (37.2 C) (Oral)   Resp 16   LMP 01/27/2024 (Exact Date)   SpO2 98%   Visual Acuity Right Eye Distance:   Left Eye Distance:   Bilateral Distance:    Right Eye Near:   Left Eye Near:    Bilateral Near:     Physical Exam Vitals and nursing note reviewed.  Constitutional:      General: She is awake. She is not in acute  distress.    Appearance: Normal appearance. She is well-developed and well-groomed. She is not ill-appearing.  HENT:     Nose: Nose normal.     Mouth/Throat:     Mouth: Mucous membranes are moist.     Pharynx: Oropharynx is clear.  Cardiovascular:     Rate and Rhythm: Normal rate and regular rhythm.  Pulmonary:     Effort: Pulmonary effort is normal.     Breath sounds: Normal breath sounds.  Musculoskeletal:        General: Normal range of motion.     Right lower leg: Normal.     Left lower leg: Normal.  Skin:    General: Skin is warm and dry.  Neurological:     General: No focal deficit present.     Mental Status: She is alert and oriented to person, place, and time. Mental status is at baseline.     GCS: GCS eye subscore is 4. GCS verbal subscore is 5. GCS motor subscore is 6.     Cranial Nerves: Cranial nerves 2-12 are intact.     Sensory: Sensation is intact.     Motor: Motor function is intact.     Coordination: Coordination is intact.     Gait: Gait is intact.  Psychiatric:        Behavior: Behavior is cooperative.      UC Treatments / Results  Labs (all labs ordered are listed, but only abnormal results are displayed) Labs Reviewed  POCT URINE PREGNANCY  POCT FASTING CBG [facility] MANUAL ENTRY    EKG   Radiology No results found.  Procedures Procedures (including critical care time)  Medications Ordered in UC Medications - No data to display  Initial Impression / Assessment and Plan / UC Course  I have reviewed the triage vital signs and the nursing notes.  Pertinent labs & imaging results that were available during my care of the patient were reviewed by me and considered in my medical decision making (see chart for details).     Patient is overall well-appearing.  Vitals are stable.  No significant findings upon exam.  No neurodeficits noted.  GCS 15.  CBG within normal limits.  Urine pregnancy negative.  EKG reveals normal sinus rhythm without ST  elevation, depression, or acute cardiac findings.  Recommended continuing follow-up with primary  care provider tomorrow for further evaluation of her ongoing symptoms.  Recommended Tylenol and ibuprofen  as needed for pain.  Discussed follow-up, return, and strict ER precautions. Final Clinical Impressions(s) / UC Diagnoses   Final diagnoses:  Dizziness  Chest tightness  Nausea  Chronic pain of both lower extremities     Discharge Instructions      Your workup today was overall reassuring. I recommend continuing with the plan to follow-up with your primary care provider tomorrow to address your concerns. You can alternate between 650 mg of Tylenol and 400 mg of ibuprofen  every 6-8 hours as needed for leg pain. If you develop worsening chest pain, passing out, or excessive vomiting please seek immediate medical treatment in the emergency department.   ED Prescriptions   None    PDMP not reviewed this encounter.   Johnie Flaming A, NP 02/07/24 1807

## 2024-02-07 NOTE — ED Triage Notes (Signed)
 Patient here today with c/o nausea, dizziness, and chest tightness X 1 month. Symptoms are off and on but mostly in the morning.   Patient also c/o bilat lower leg pain for about a year now. No known injury.

## 2024-02-07 NOTE — Discharge Instructions (Signed)
 Your workup today was overall reassuring. I recommend continuing with the plan to follow-up with your primary care provider tomorrow to address your concerns. You can alternate between 650 mg of Tylenol and 400 mg of ibuprofen  every 6-8 hours as needed for leg pain. If you develop worsening chest pain, passing out, or excessive vomiting please seek immediate medical treatment in the emergency department.

## 2024-05-16 ENCOUNTER — Telehealth: Admitting: Physician Assistant

## 2024-05-16 DIAGNOSIS — R82998 Other abnormal findings in urine: Secondary | ICD-10-CM

## 2024-05-16 DIAGNOSIS — R300 Dysuria: Secondary | ICD-10-CM

## 2024-05-17 ENCOUNTER — Encounter: Admitting: Physician Assistant

## 2024-05-17 NOTE — Progress Notes (Signed)
  Because of dark red/brown urine as well as atypical vaginal discharge with urinary symptoms, I feel your condition warrants further evaluation and I recommend that you be seen in a face-to-face visit.   NOTE: There will be NO CHARGE for this E-Visit   If you are having a true medical emergency, please call 911.     For an urgent face to face visit, [facility] has multiple urgent care centers for your convenience.  Click the link below for the full list of locations and hours, walk-in wait times, appointment scheduling options and driving directions:  Urgent Care - [place], [place], [place], [place], [place], [place]  [place]     Your MyChart E-visit questionnaire answers were reviewed by a board certified advanced clinical practitioner to complete your personal care plan based on your specific symptoms.    Thank you for using e-Visits.

## 2024-05-17 NOTE — Progress Notes (Signed)
 Duplicate e-visit. Will mark as erroneous encounter.

## 2024-06-19 ENCOUNTER — Other Ambulatory Visit: Payer: Self-pay

## 2024-06-19 ENCOUNTER — Emergency Department (HOSPITAL_COMMUNITY)
Admission: EM | Admit: 2024-06-19 | Discharge: 2024-06-19 | Disposition: A | Discharge status: Home / Self Care | Attending: Emergency Medicine | Admitting: Emergency Medicine

## 2024-06-19 ENCOUNTER — Emergency Department (HOSPITAL_COMMUNITY)

## 2024-06-19 DIAGNOSIS — K2960 Other gastritis without bleeding: Secondary | ICD-10-CM

## 2024-06-19 DIAGNOSIS — K219 Gastro-esophageal reflux disease without esophagitis: Secondary | ICD-10-CM | POA: Diagnosis not present

## 2024-06-19 DIAGNOSIS — R1013 Epigastric pain: Secondary | ICD-10-CM | POA: Diagnosis present

## 2024-06-19 DIAGNOSIS — K2970 Gastritis, unspecified, without bleeding: Secondary | ICD-10-CM | POA: Diagnosis not present

## 2024-06-19 MED ORDER — PANTOPRAZOLE SODIUM 40 MG PO TBEC
40.0000 mg | DELAYED_RELEASE_TABLET | Freq: Once | ORAL | Status: AC
  Administered 2024-06-19: 40 mg via ORAL
  Filled 2024-06-19: qty 1

## 2024-06-19 MED ORDER — LIDOCAINE VISCOUS HCL 2 % MT SOLN
15.0000 mL | Freq: Once | OROMUCOSAL | Status: AC
  Administered 2024-06-19: 15 mL via OROMUCOSAL
  Filled 2024-06-19: qty 15

## 2024-06-19 MED ORDER — ALUMINUM & MAGNESIUM HYDROXIDE 200-200 MG/5ML PO SUSP: 5.0000 mL | Freq: Four times a day (QID) | ORAL | 0 refills | Status: AC | PRN

## 2024-06-19 MED ORDER — FAMOTIDINE 20 MG PO TABS
20.0000 mg | ORAL_TABLET | Freq: Once | ORAL | Status: AC
  Administered 2024-06-19: 20 mg via ORAL
  Filled 2024-06-19: qty 1

## 2024-06-19 MED ORDER — ALUM & MAG HYDROXIDE-SIMETH 200-200-20 MG/5ML PO SUSP
30.0000 mL | Freq: Once | ORAL | Status: AC
  Administered 2024-06-19: 30 mL via ORAL
  Filled 2024-06-19: qty 30

## 2024-06-19 NOTE — ED Provider Notes (Signed)
 [place] EMERGENCY DEPARTMENT AT [facility] Provider Note   CSN: [id number] Arrival date & time: 06/19/24  1152     History Chief Complaint  Patient presents with   Gastroesophageal Reflux    HPI: Alexis Parker is a 23 y.o. female with history pertinent for chronic vertigo on meclizine who presents complaining of burning pain radiating to the chest. Patient arrived via POV accompanied by twin sister and mother.  History provided by patient.  No interpreter required during this encounter.  Patient reports that she has had intermittent burning pain radiating from her epigastrium into her chest, reports that this is in accompanied by a sour taste in her mouth.  Reports that it has been worse over the past several days.  Reports that she went to urgent care yesterday, and was told that it was likely reflux, and was told to take OTC famotidine.  Reports that she took this this morning however did not have relief of her symptoms.  Reports that symptoms are slightly worse than baseline, therefore she came to the emergency department for further evaluation, denies any fever, chills, shortness of breath.  Reports that the Maalox that she received prior to my evaluation has partially alleviated her symptoms.  Patient's recorded medical, surgical, social, medication list and allergies were reviewed in the Snapshot window as part of the initial history.   Prior to Admission medications   Medication Sig Start Date End Date Taking? Authorizing Provider  aluminum-magnesium hydroxide 200-200 MG/5ML suspension Take 5 mLs by mouth every 6 (six) hours as needed for up to 5 days for indigestion. 06/19/24 06/24/24 Yes Rogelia Jerilynn RAMAN, MD  famotidine (PEPCID) 20 MG tablet Take 20 mg by mouth. 06/18/24 06/18/25 Yes [provider]  cetirizine (ZYRTEC) 10 MG tablet Take 10 mg by mouth daily. 03/04/18   [provider]  medroxyPROGESTERone Acetate 150 MG/ML SUSY Inject 1 mL into  the muscle every 3 (three) months. 09/03/23   [provider]  methocarbamol  (ROBAXIN ) 500 MG tablet Take 1 tablet (500 mg total) by mouth 2 (two) times daily. 10/03/23   Johnie Flaming A, NP  riboflavin (VITAMIN B-2) 100 MG TABS tablet Take 100 mg by mouth daily.    [provider]     Allergies: Patient has no known allergies.   Review of Systems   ROS as per HPI  Physical Exam Updated Vital Signs BP 114/67 (BP Location: Right Arm)   Pulse 74   Temp 98.1 F (36.7 C) (Oral)   Resp 18   LMP 05/28/2024   SpO2 100%  Physical Exam Vitals and nursing note reviewed.  Constitutional:      General: She is not in acute distress.    Appearance: She is well-developed.  HENT:     Head: Normocephalic and atraumatic.  Eyes:     Conjunctiva/sclera: Conjunctivae normal.  Cardiovascular:     Rate and Rhythm: Normal rate and regular rhythm.     Heart sounds: No murmur heard. Pulmonary:     Effort: Pulmonary effort is normal. No respiratory distress.     Breath sounds: Normal breath sounds.  Abdominal:     Palpations: Abdomen is soft.     Tenderness: There is no abdominal tenderness.  Musculoskeletal:        General: No swelling.     Cervical back: Neck supple.  Skin:    General: Skin is warm and dry.     Capillary Refill: Capillary refill takes less than 2 seconds.  Neurological:     Mental Status: She is alert.  Psychiatric:        Mood and Affect: Mood normal.     ED Course/ Medical Decision Making/ A&P    Procedures Procedures   Medications Ordered in ED Medications  alum & mag hydroxide-simeth (MAALOX/MYLANTA) 200-200-20 MG/5ML suspension 30 mL (30 mLs Oral Given 06/19/24 1220)  pantoprazole (PROTONIX) EC tablet 40 mg (40 mg Oral Given 06/19/24 1334)  famotidine (PEPCID) tablet 20 mg (20 mg Oral Given 06/19/24 1334)  lidocaine (XYLOCAINE) 2 % viscous mouth solution 15 mL (15 mLs Mouth/Throat Given 06/19/24 1334)    Medical Decision Making:    Thamar Holik is a 23 y.o. female who presents for burning pain in the epigastrium radiating into chest as per above.  Physical exam is pertinent for no focal abnormalities.   The differential includes but is not limited to GERD, ACS, pneumonia, pneumothorax, pleural effusion.  Independent historian: None  External data reviewed: No pertinent external data  Labs: Not indicated  Radiology: Ordered, Independent interpretation, Details: Chest x-ray without focal airspace opacification, cardiomediastinal silhouette gentian, pneumothorax, pleural effusion, bony derangement., and All images reviewed independently.  Agree with radiology report at this time.   DG Chest 2 View Result Date: 06/19/2024 CLINICAL DATA:  Burning-type epigastric pain. EXAM: CHEST - 2 VIEW COMPARISON:  02/01/2023. FINDINGS: Normal heart, mediastinum and hila. Clear lungs.  No pleural effusion or pneumothorax. Skeletal structures are within normal limits. IMPRESSION: Normal chest radiographs Electronically Signed   By: Alm Parkins M.D.   On: 06/19/2024 13:14    EKG/Medicine tests: Ordered and Independent interpretation EKG Interpretation: Sinus rhythm Confirmed by Rogelia Satterfield ([id number]) on 06/19/2024 2:21:45 PM                Interventions: Maalox/viscous lidocaine, Protonix, Pepcid  See the EMR for full details regarding lab and imaging results.  Patient presents to the emergency department for burning epigastric pain radiated into chest.  Given patient reports that symptoms are worse than prior, do feel that screening EKG and chest x-ray are reasonable, these were obtained and are reassuring against acute emergent process.  Do not feel that patient warrants troponin given description of pain is most consistent with GERD, patient is otherwise young and healthy with no cardiac risk factors.  Patient treated empirically for GERD with Maalox/viscous lidocaine, Protonix, Pepcid with full resolution of symptoms while in  the emergency department.  Recommended a 2-week course of Prilosec/Pepcid, with as needed Maalox, and continuation of Pepcid as needed thereafter.  Discussed plan for follow-up with PCP for further evaluation and recommendations, patient and family at bedside comfortable with this plan.  Presentation is most consistent with acute uncomplicated illness and I did consider and rule out acute life/limb-threatening illness  Discussion of management or test interpretations with external provider(s): Not indicated  Risk Drugs:OTC drugs and Prescription drug management  Disposition: DISCHARGE: I believe that the patient is safe for discharge home with outpatient follow-up. Patient was informed of all pertinent physical exam, laboratory, and imaging findings. Patient's suspected etiology of their symptom presentation was discussed with the patient and all questions were answered. We discussed following up with PCP. I provided thorough ED return precautions. The patient feels safe and comfortable with this plan.  MDM generated using voice dictation software and may contain dictation errors.  Please contact me for any clarification or with any questions.  Clinical Impression:  1. Reflux gastritis      Discharge   Final  Clinical Impression(s) / ED Diagnoses Final diagnoses:  Reflux gastritis    Rx / DC Orders ED Discharge Orders          Ordered    aluminum-magnesium hydroxide 200-200 MG/5ML suspension  Every 6 hours PRN        06/19/24 1426             Rogelia Jerilynn RAMAN, MD 06/19/24 1451

## 2024-06-19 NOTE — ED Triage Notes (Signed)
 Pt arrives via EMS from the [facility]. Pt reports epigastric burning that was dx as GERD yesterday at urgent care. Pt states that she took rx famotidine this morning, but it did not help.

## 2024-06-19 NOTE — Discharge Instructions (Addendum)
 Alexis Parker  Thank you for allowing us  to take care of you today.  You came to the Emergency Department today because had burning pain in your belly and chest, this is common with reflux, which is caused by a inflammation/overproduction of acid in your stomach.  We recommend taking acid reducing medication.  You can take omeprazole (Prilosec) daily for 2 weeks, alongside this you should take famotidine  (Pepcid ) daily.  After 2 weeks you should stop taking the Prilosec, however you can continue to take the Pepcid  as needed, this will help reduce the acid production in your stomach.  You can also use Maalox as needed for breakthrough symptoms despite the Prilosec and Pepcid .  You should follow-up with your primary care doctor, and if you have persistent symptoms or recurrent symptoms after finishing your course of Prilosec, they may discuss with you a plan such as a referral to a GI doctor.  To-Do: 1. Please follow-up with your primary doctor within 1 month / as soon as possible.   Please return to the Emergency Department or call 911 if you experience have worsening of your symptoms, or do not get better, chest pain, shortness of breath, severe or significantly worsening pain, high fever, severe confusion, pass out or have any reason to think that you need emergency medical care.   We hope you feel better soon.   Mitzie Later, MD Department of Emergency Medicine [facility] [place]

## 2024-07-23 ENCOUNTER — Telehealth: Admitting: Nurse Practitioner

## 2024-07-23 DIAGNOSIS — M2550 Pain in unspecified joint: Secondary | ICD-10-CM

## 2024-07-23 MED ORDER — IBUPROFEN 800 MG PO TABS: 800.0000 mg | ORAL_TABLET | Freq: Three times a day (TID) | ORAL | 0 refills | Status: AC | PRN
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
